# Patient Record
Sex: Female | Born: 2000 | Race: Black or African American | Hispanic: No | Marital: Single | Smoking: Never smoker
Health system: Southern US, Community
[De-identification: ages and names within clinical notes are randomized; demographics above are authoritative.]

---
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
## 2022-02-02 ENCOUNTER — Encounter (HOSPITAL_COMMUNITY): Payer: Self-pay

## 2022-02-02 ENCOUNTER — Emergency Department (HOSPITAL_COMMUNITY): Payer: Commercial Managed Care - HMO

## 2022-02-02 ENCOUNTER — Emergency Department (HOSPITAL_COMMUNITY)
Admission: EM | Admit: 2022-02-02 | Discharge: 2022-02-02 | Disposition: A | Discharge status: Home / Self Care | Payer: Commercial Managed Care - HMO | Attending: Emergency Medicine | Admitting: Emergency Medicine

## 2022-02-02 ENCOUNTER — Other Ambulatory Visit: Payer: Self-pay

## 2022-02-02 DIAGNOSIS — J029 Acute pharyngitis, unspecified: Secondary | ICD-10-CM | POA: Diagnosis not present

## 2022-02-02 DIAGNOSIS — R059 Cough, unspecified: Secondary | ICD-10-CM | POA: Diagnosis present

## 2022-02-02 DIAGNOSIS — Z1152 Encounter for screening for COVID-19: Secondary | ICD-10-CM | POA: Insufficient documentation

## 2022-02-02 LAB — RESP PANEL BY RT-PCR (FLU A&B, COVID) ARPGX2
Influenza A by PCR: NEGATIVE
Influenza B by PCR: NEGATIVE
SARS Coronavirus 2 by RT PCR: NEGATIVE

## 2022-02-02 LAB — GROUP A STREP BY PCR: Group A Strep by PCR: NOT DETECTED

## 2022-02-02 NOTE — ED Triage Notes (Signed)
Pt arrived POV from home c/o a cough and sore throat since 12/2. Pt states she was doing salt water rinses and thought it was getting better but last night it got worse. Pt states it hurts to swallow and also endorses chest pain when she coughs.

## 2022-02-02 NOTE — Discharge Instructions (Signed)
As we discussed, your strep and flu and covid tests were negative. You likely have viral infection. Take tylenol, motrin for sore throat and stay hydrated   Rest for 2 days   See your doctor for follow up   Return to ER if you have worse sore throat, trouble swallowing, fever.

## 2022-02-02 NOTE — ED Provider Triage Note (Signed)
Emergency Medicine Provider Triage Evaluation Note  Roberta Pearson , a 21 y.o. female  was evaluated in triage.  Pt complains of cold sxs. Cough and sore throat for the past 4 days, increase discomfort with swallowing.  No n/v/d  Review of Systems  Positive: As above Negative: As above  Physical Exam  BP (!) 152/85   Pulse (!) 58   Temp 98.4 F (36.9 C)   Resp 16   Ht 5\' 7"  (1.702 m)   Wt 80.7 kg   SpO2 100%   BMI 27.88 kg/m  Gen:   Awake, no distress   Resp:  Normal effort  MSK:   Moves extremities without difficulty  Other:    Medical Decision Making  Medically screening exam initiated at 1:05 PM.  Appropriate orders placed.  Addaline Peplinski was informed that the remainder of the evaluation will be completed by another provider, this initial triage assessment does not replace that evaluation, and the importance of remaining in the ED until their evaluation is complete.     Cresenciano Lick, PA-C 02/02/22 1512

## 2022-02-10 NOTE — ED Provider Notes (Signed)
[facility] EMERGENCY DEPARTMENT Provider Note   CSN: [id number] Arrival date & time: 02/02/22  1118     History  Chief Complaint  Patient presents with   Sore Throat   Cough    Roberta Pearson is a 21 y.o. female here with sore throat. Patient has been coughing and sore throat for the last 4 days. No known sick contacts. States that she loss her voice.   The history is provided by the patient.       Home Medications Prior to Admission medications   Not on File      Allergies    Patient has no known allergies.    Review of Systems   Review of Systems  Respiratory:  Positive for cough.   All other systems reviewed and are negative.   Physical Exam Updated Vital Signs BP 127/77 (BP Location: Right Arm)   Pulse 61   Temp 98.9 F (37.2 C) (Oral)   Resp 16   Ht 5\' 7"  (1.702 m)   Wt 80.7 kg   SpO2 99%   BMI 27.88 kg/m  Physical Exam Vitals and nursing note reviewed.  Constitutional:      Appearance: She is well-developed.  HENT:     Head: Normocephalic.     Right Ear: Tympanic membrane normal.     Left Ear: Tympanic membrane normal.     Mouth/Throat:     Mouth: Mucous membranes are moist.     Comments: No obvious tonsillar exudates, no tongue swelling  Pulmonary:     Effort: Pulmonary effort is normal.     Breath sounds: Normal breath sounds.  Abdominal:     General: Bowel sounds are normal.     Palpations: Abdomen is soft.  Skin:    General: Skin is warm.     Capillary Refill: Capillary refill takes less than 2 seconds.  Neurological:     General: No focal deficit present.     Mental Status: She is alert and oriented to person, place, and time.  Psychiatric:        Mood and Affect: Mood normal.        Behavior: Behavior normal.     ED Results / Procedures / Treatments   Labs (all labs ordered are listed, but only abnormal results are displayed) Labs Reviewed  RESP PANEL BY RT-PCR (FLU A&B, COVID) ARPGX2  GROUP A STREP  BY PCR    EKG EKG Interpretation  Date/Time:  Wednesday February 02 2022 12:46:49 EST Ventricular Rate:  73 PR Interval:  136 QRS Duration: 90 QT Interval:  386 QTC Calculation: 425 R Axis:   80 Text Interpretation: Normal sinus rhythm with sinus arrhythmia Normal ECG No previous ECGs available Confirmed by 1974 [phone]) on 02/02/2022 4:23:51 PM  Radiology No results found.  Procedures Procedures    Medications Ordered in ED Medications - No data to display  ED Course/ Medical Decision Making/ A&P                           Medical Decision Making Roberta Pearson is a 21 y.o. female here with sore throat. Patient is well appearing. COVID, flu, and strep negative. Likely has viral pharyngitis. Recommend rest and tylenol and motrin and outpatient follow up    Problems Addressed: Viral pharyngitis: acute illness or injury  Amount and/or Complexity of Data Reviewed Labs: ordered. Decision-making details documented in ED Course.    Final Clinical Impression(s) /  ED Diagnoses Final diagnoses:  Viral pharyngitis    Rx / DC Orders ED Discharge Orders     None         Charlynne Pander, MD 02/10/22 1529

## 2022-07-27 ENCOUNTER — Other Ambulatory Visit: Payer: Self-pay

## 2022-07-27 ENCOUNTER — Emergency Department (HOSPITAL_COMMUNITY)
Admission: EM | Admit: 2022-07-27 | Discharge: 2022-07-28 | Disposition: A | Discharge status: Home / Self Care | Payer: 59 | Attending: Emergency Medicine | Admitting: Emergency Medicine

## 2022-07-27 DIAGNOSIS — Z1152 Encounter for screening for COVID-19: Secondary | ICD-10-CM | POA: Insufficient documentation

## 2022-07-27 DIAGNOSIS — B9789 Other viral agents as the cause of diseases classified elsewhere: Secondary | ICD-10-CM | POA: Diagnosis not present

## 2022-07-27 DIAGNOSIS — J028 Acute pharyngitis due to other specified organisms: Secondary | ICD-10-CM | POA: Diagnosis not present

## 2022-07-27 DIAGNOSIS — J029 Acute pharyngitis, unspecified: Secondary | ICD-10-CM | POA: Insufficient documentation

## 2022-07-27 LAB — RESP PANEL BY RT-PCR (RSV, FLU A&B, COVID)  RVPGX2
Influenza A by PCR: NEGATIVE
Influenza B by PCR: NEGATIVE
Resp Syncytial Virus by PCR: NEGATIVE
SARS Coronavirus 2 by RT PCR: NEGATIVE

## 2022-07-27 LAB — GROUP A STREP BY PCR: Group A Strep by PCR: NOT DETECTED

## 2022-07-27 MED ORDER — LIDOCAINE VISCOUS HCL 2 % MT SOLN
15.0000 mL | Freq: Once | OROMUCOSAL | Status: AC
  Administered 2022-07-28: 15 mL via OROMUCOSAL
  Filled 2022-07-27: qty 15

## 2022-07-27 MED ORDER — DEXAMETHASONE 4 MG PO TABS
10.0000 mg | ORAL_TABLET | Freq: Once | ORAL | Status: AC
  Administered 2022-07-28: 10 mg via ORAL
  Filled 2022-07-27: qty 3

## 2022-07-27 NOTE — ED Provider Notes (Signed)
[place] [facility] EMERGENCY DEPARTMENT AT [facility] Provider Note   CSN: [id number] Arrival date & time: 07/27/22  2102     History  Chief Complaint  Patient presents with   Sore Throat    Roberta Pearson is a 22 y.o. female.  22 year old female previously healthy resents to the emergency department with cough, congestion, and sore throat.  Reports that since yesterday she has had the above symptoms.  Has had a productive cough.  No fevers.  Does have muscle aches.  Says that her throat is very sore as well.  No significant chest pain or shortness of breath.  Also sinus pressure.  Does have a friend that has been sick recently.       Home Medications Prior to Admission medications   Medication Sig Start Date End Date Taking? Authorizing Provider  amoxicillin-clavulanate (AUGMENTIN) 875-125 MG tablet Take 1 tablet by mouth every 12 (twelve) hours. 07/28/22  Yes Rondel Baton, MD      Allergies    Patient has no known allergies.    Review of Systems   Review of Systems  Physical Exam Updated Vital Signs BP (!) 130/104 (BP Location: Right Arm)   Pulse (!) 102   Temp 98.6 F (37 C) (Oral)   Resp 16   Ht 5\' 6"  (1.676 m)   Wt 113.4 kg   SpO2 100%   BMI 40.35 kg/m  Physical Exam Vitals and nursing note reviewed.  Constitutional:      General: She is not in acute distress.    Appearance: She is well-developed.     Comments: Voice normal.  Tolerating her secretions.  HENT:     Head: Normocephalic and atraumatic.     Comments: Bilateral maxillary sinus tenderness to palpation    Right Ear: External ear normal.     Left Ear: External ear normal.     Nose: Nose normal.     Mouth/Throat:     Mouth: Mucous membranes are moist.     Pharynx: Oropharynx is clear. No oropharyngeal exudate or posterior oropharyngeal erythema.     Comments: Uvula midline Eyes:     Extraocular Movements: Extraocular movements intact.     Conjunctiva/sclera: Conjunctivae normal.      Pupils: Pupils are equal, round, and reactive to light.  Cardiovascular:     Rate and Rhythm: Normal rate and regular rhythm.     Heart sounds: No murmur heard. Pulmonary:     Effort: Pulmonary effort is normal. No respiratory distress.     Breath sounds: Normal breath sounds. No stridor.  Musculoskeletal:     Cervical back: Normal range of motion and neck supple.     Right lower leg: No edema.     Left lower leg: No edema.  Skin:    General: Skin is warm and dry.  Neurological:     Mental Status: She is alert and oriented to person, place, and time. Mental status is at baseline.  Psychiatric:        Mood and Affect: Mood normal.     ED Results / Procedures / Treatments   Labs (all labs ordered are listed, but only abnormal results are displayed) Labs Reviewed  RESP PANEL BY RT-PCR (RSV, FLU A&B, COVID)  RVPGX2  GROUP A STREP BY PCR    EKG None  Radiology No results found.  Procedures Procedures    Medications Ordered in ED Medications  dexamethasone (DECADRON) tablet 10 mg (has no administration in time range)  lidocaine (  XYLOCAINE) 2 % viscous mouth solution 15 mL (has no administration in time range)    ED Course/ Medical Decision Making/ A&P                             Medical Decision Making Risk Prescription drug management.    Roberta Pearson is a 22 y.o. female who presents with URI symptoms  Initial Ddx:  URI, sinusitis, pneumonia, strep throat  MDM:  Feel the patient likely has a URI based on their symptoms.  They are overall well-appearing so do not feel that chest x-ray is warranted.  Based on timeline and severity of symptoms feel that treatment for sinusitis is not warranted at this time but will give the patient a prescription of amoxicillin and hand to take if her symptoms do not improve in 10 days.  Did have a rapid strep from triage that was negative.  Plan:  COVID/flu Rapid strep  Decadron Lidocaine  ED  Summary/Re-evaluation:  Patient reevaluated in the emergency department and was stable. Feel that they are suitable for outpatient workup at this time so we will have them follow-up with their primary doctor in 2 to 3 days.  This patient presents to the ED for concern of complaints listed in HPI, this involves an extensive number of treatment options, and is a complaint that carries with it a high risk of complications and morbidity. Disposition including potential need for admission considered.   Dispo: DC Home. Return precautions discussed including, but not limited to, those listed in the AVS. Allowed pt time to ask questions which were answered fully prior to dc.  Additional history obtained from  friend Records reviewed ED Visit Notes I independently reviewed the following imaging with scope of interpretation limited to determining acute life threatening conditions related to emergency care: Chest x-ray and agree with the radiologist interpretation with the following exceptions: None I personally reviewed and interpreted cardiac monitoring: normal sinus rhythm  I have reviewed the patients home medications and made adjustments as needed         Final Clinical Impression(s) / ED Diagnoses Final diagnoses:  Viral pharyngitis    Rx / DC Orders ED Discharge Orders          Ordered    amoxicillin-clavulanate (AUGMENTIN) 875-125 MG tablet  Every 12 hours        07/28/22 0000              Rondel Baton, MD 07/28/22 0005

## 2022-07-27 NOTE — ED Provider Triage Note (Signed)
Emergency Medicine Provider Triage Evaluation Note  Roberta Pearson , a 22 y.o. female  was evaluated in triage.  Pt complains of sore throat, cough, congestion since yesterday.  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  She denies chest pain, shortness of breath.  She is endorsing nausea and vomiting.  Review of Systems  Positive:  Negative:   Physical Exam  There were no vitals taken for this visit. Gen:   Awake, no distress   Resp:  Normal effort  MSK:   Moves extremities without difficulty  Other:    Medical Decision Making  Medically screening exam initiated at 9:11 PM.  Appropriate orders placed.  Roberta Pearson was informed that the remainder of the evaluation will be completed by another provider, this initial triage assessment does not replace that evaluation, and the importance of remaining in the ED until their evaluation is complete.     Al Decant, PA-C 07/27/22 2112

## 2022-07-27 NOTE — ED Triage Notes (Signed)
Pt arrives to ED c/o sore throat x 1 day. Pt endorses productive cough, no fever

## 2022-07-28 DIAGNOSIS — J029 Acute pharyngitis, unspecified: Secondary | ICD-10-CM | POA: Diagnosis not present

## 2022-07-28 MED ORDER — AMOXICILLIN-POT CLAVULANATE 875-125 MG PO TABS: 1.0000 | ORAL_TABLET | Freq: Two times a day (BID) | ORAL | 0 refills | Status: AC

## 2022-07-28 NOTE — Discharge Instructions (Addendum)
You were seen for your upper respiratory tract infection in the emergency department.   At home, please use Tylenol and ibuprofen for your muscle aches and pains.  Please use over-the-counter cough medication or tea with honey for your cough.  If your symptoms do not improve in 10 days and you are still having sinus pressure and congestion please take the antibiotic (augmentin) that we have prescribed you.  Follow-up with your primary doctor in 2-3 days regarding your visit.  This may be over the phone if you are feeling better or in person if you are feeling worse. If you do not have a primary care doctor you may follow-up with [facility] which is listed in this packet.  Return immediately to the emergency department if you experience any of the following: Difficulty breathing, or any other concerning symptoms.    Thank you for visiting our Emergency Department. It was a pleasure taking care of you today.

## 2024-01-10 IMAGING — DX DG KNEE COMPLETE 4+V*L*
4 series · 4 of 4 positions shown · non-contrast
Comparison: None Available.

CLINICAL DATA: Trauma, fall

EXAM:
LEFT KNEE - COMPLETE 4+ VIEW

[t knee ap left]
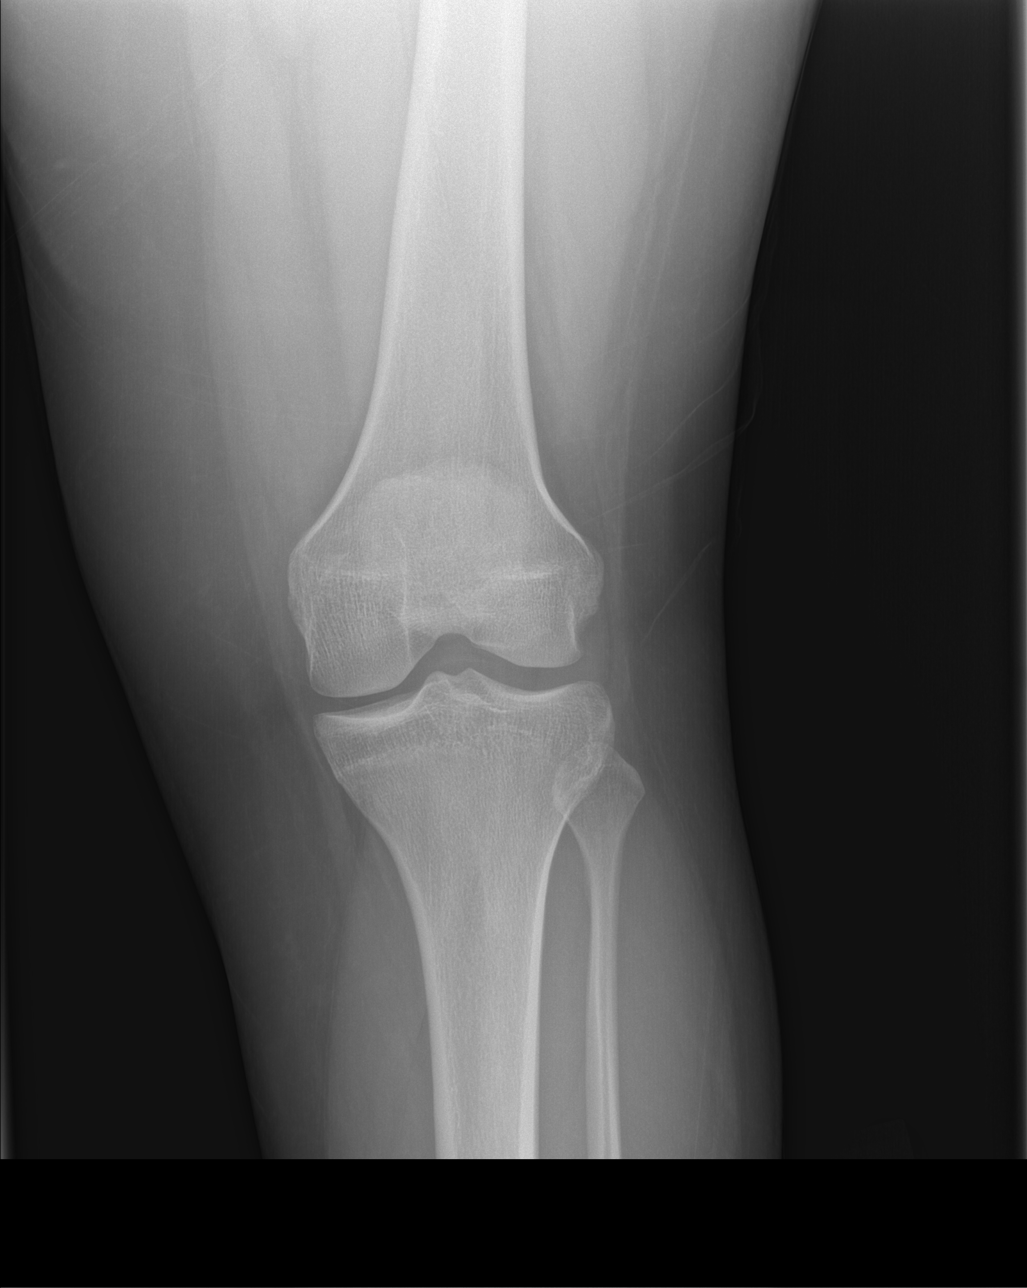

[t knee obl left (1 of 2)]
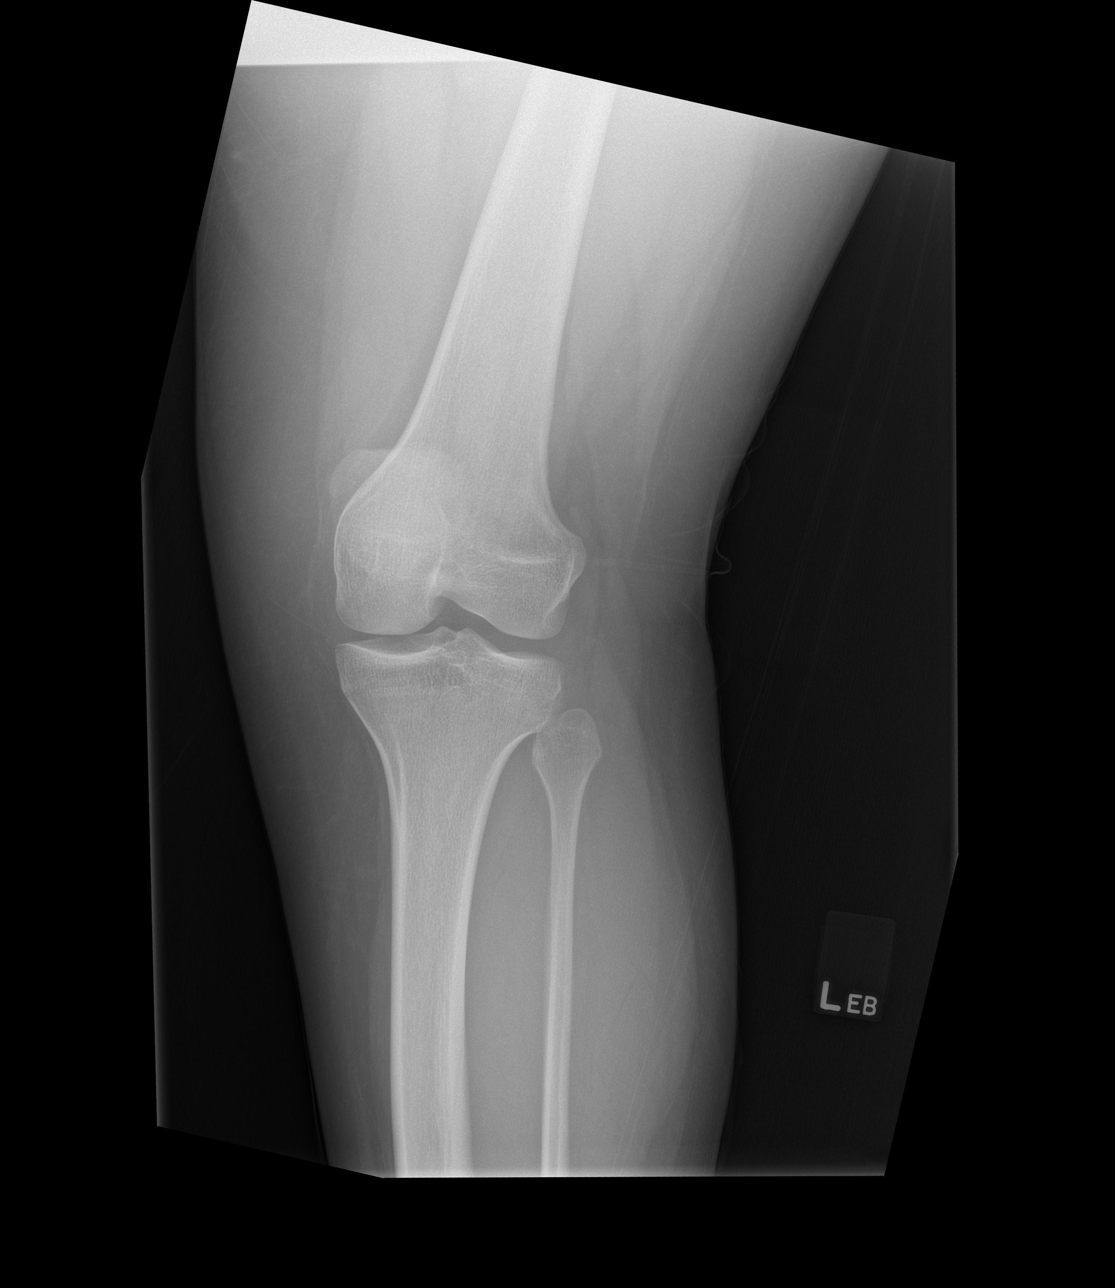

[t knee obl left (2 of 2)]
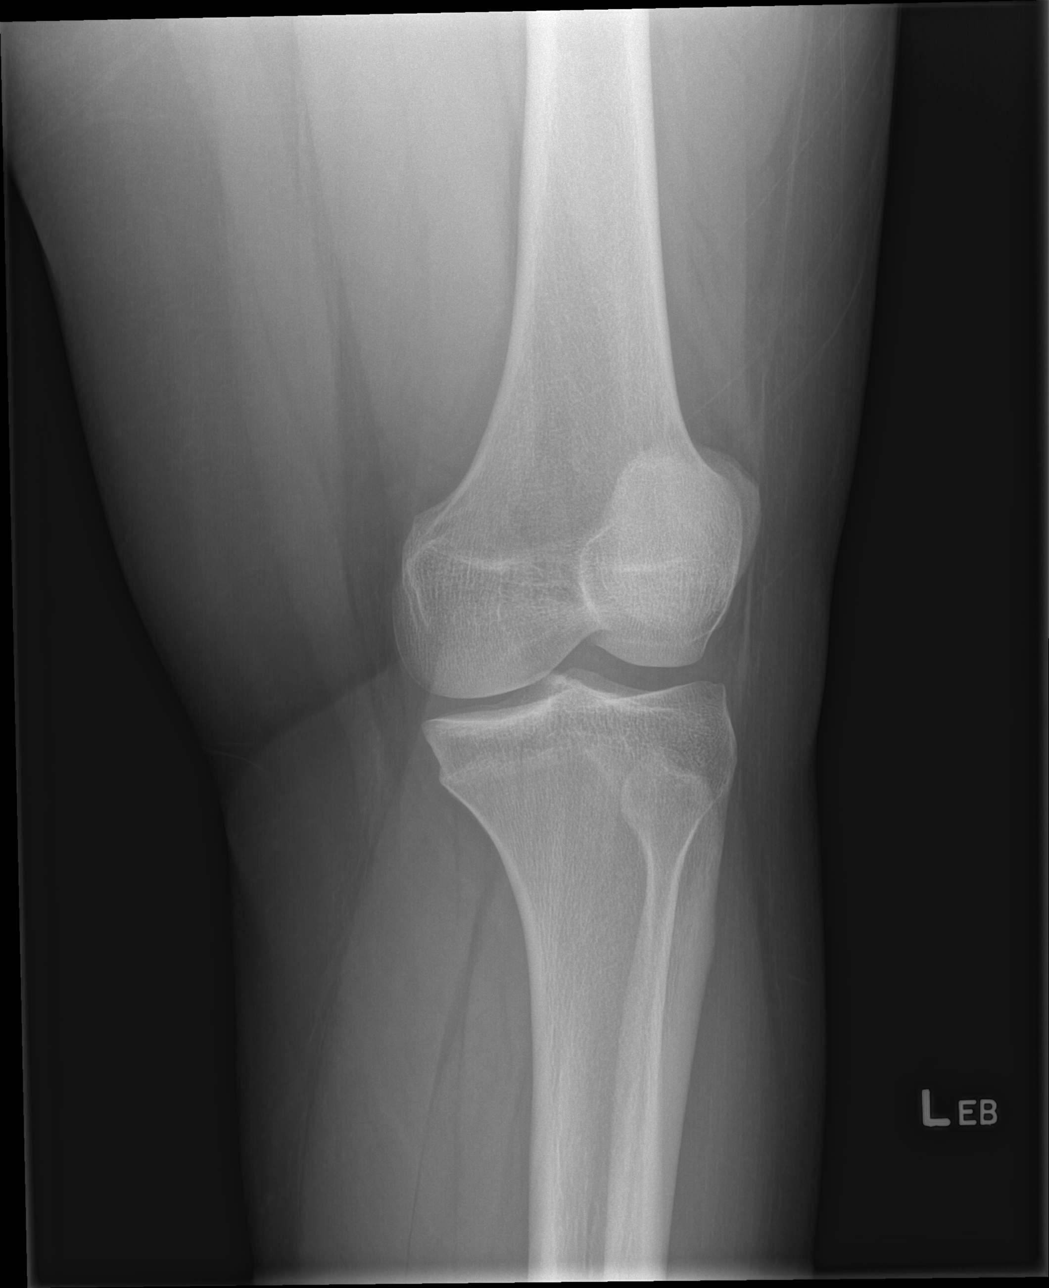

[t knee lat left]
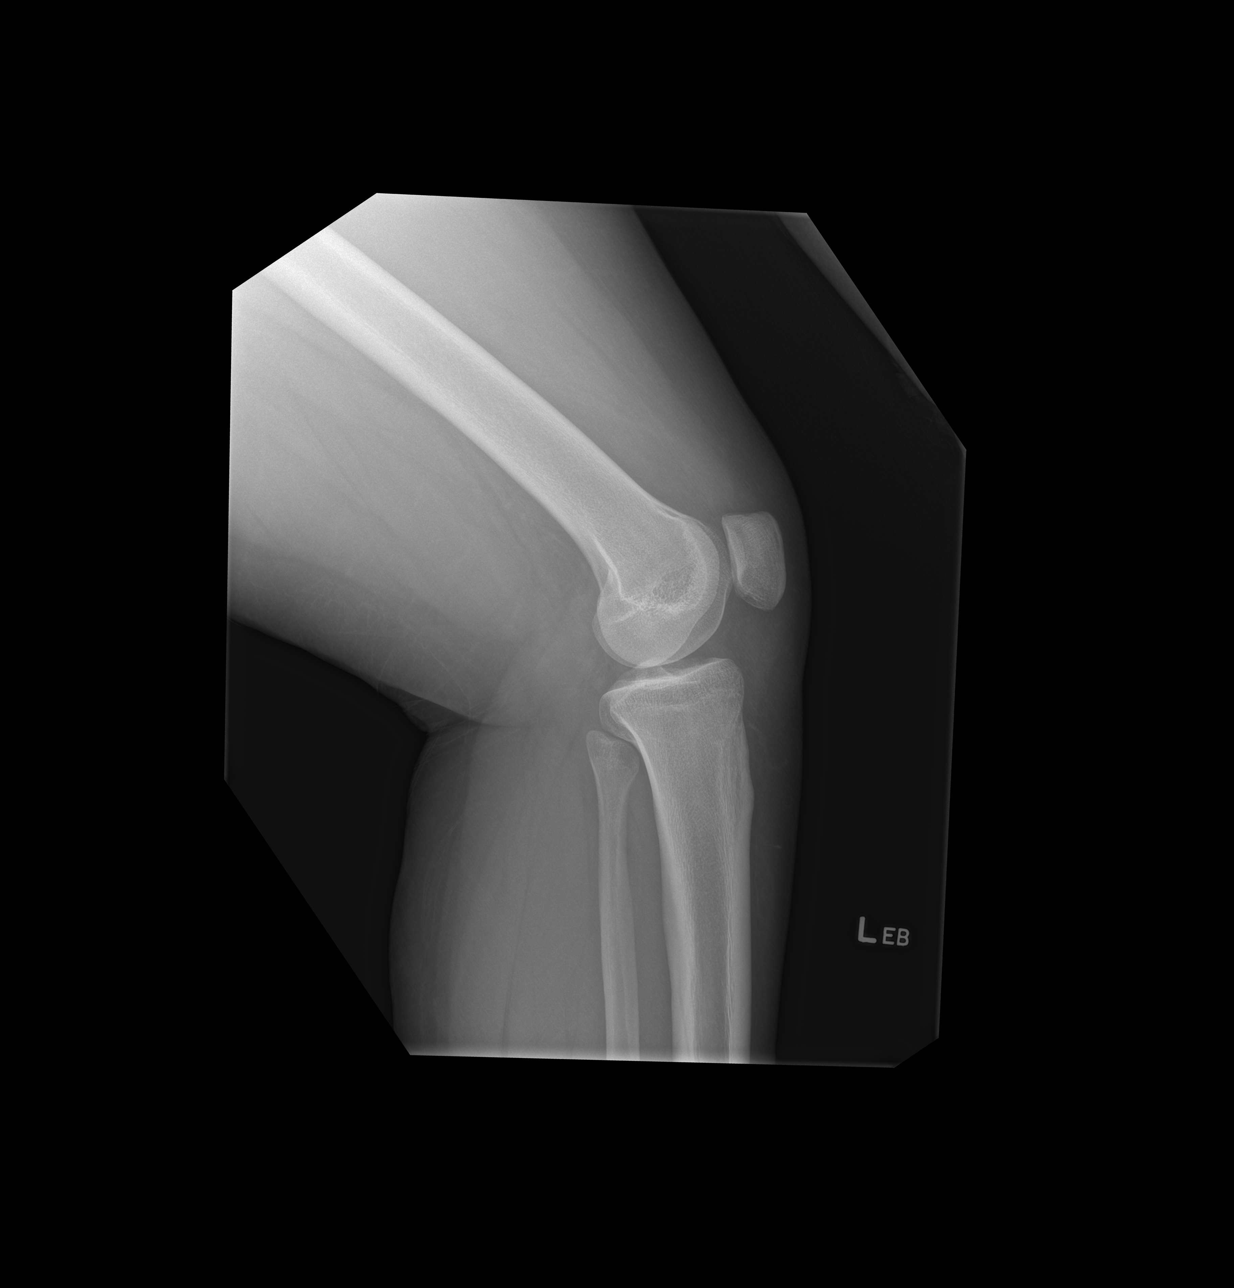

[4 of 4 positions shown; findings below may reference images not displayed]

FINDINGS: No evidence of fracture, dislocation, or joint effusion. No evidence
of arthropathy or other focal bone abnormality. Soft tissues are
unremarkable.
IMPRESSION: No fracture or dislocation is seen in the left knee. There is no
significant effusion.
